# Patient Record
Sex: Female | Born: 1985 | Race: White | Hispanic: No | Marital: Single
Health system: Southern US, Community
[De-identification: ages and names within clinical notes are randomized; demographics above are authoritative.]

---
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
## 2006-02-15 ENCOUNTER — Ambulatory Visit: Payer: Self-pay | Admitting: Family Medicine

## 2006-04-26 ENCOUNTER — Ambulatory Visit: Payer: Self-pay | Admitting: Family Medicine

## 2006-09-06 ENCOUNTER — Ambulatory Visit: Payer: Self-pay | Admitting: Family Medicine

## 2006-09-19 ENCOUNTER — Ambulatory Visit: Payer: Self-pay | Admitting: Family Medicine

## 2006-09-19 DIAGNOSIS — IMO0002 Reserved for concepts with insufficient information to code with codable children: Secondary | ICD-10-CM | POA: Insufficient documentation

## 2006-09-25 ENCOUNTER — Telehealth: Payer: Self-pay | Admitting: Family Medicine

## 2006-09-29 ENCOUNTER — Encounter: Admission: RE | Admit: 2006-09-29 | Discharge: 2006-09-29 | Payer: Self-pay | Admitting: Family Medicine

## 2006-09-29 ENCOUNTER — Ambulatory Visit: Payer: Self-pay | Admitting: Family Medicine

## 2006-10-02 ENCOUNTER — Encounter: Payer: Self-pay | Admitting: Family Medicine

## 2006-11-24 IMAGING — CR DG FOOT COMPLETE 3+V*L*
3 series · 3 of 3 positions shown · non-contrast
Comparison: none

CLINICAL DATA: Lateral foot pain and swelling

LEFT FOOT - 3 VIEW

[view not recorded (1 of 3)]
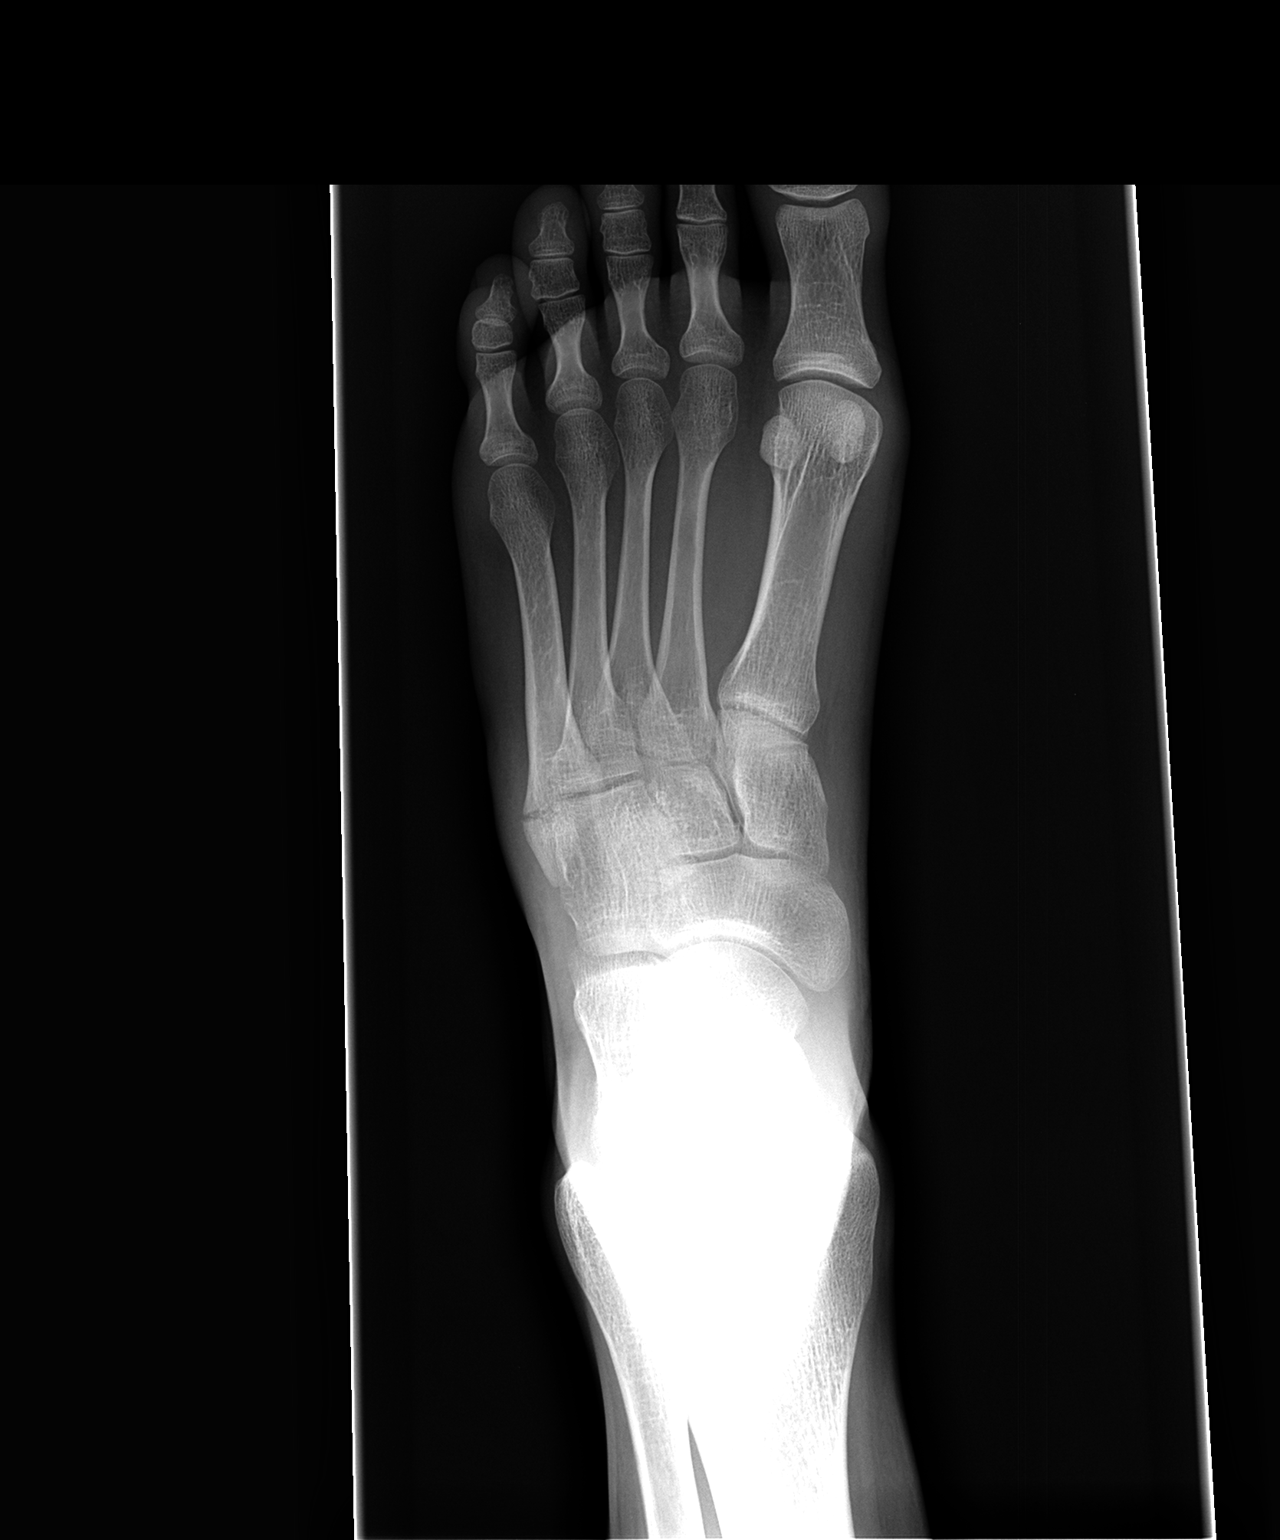

[view not recorded (2 of 3)]
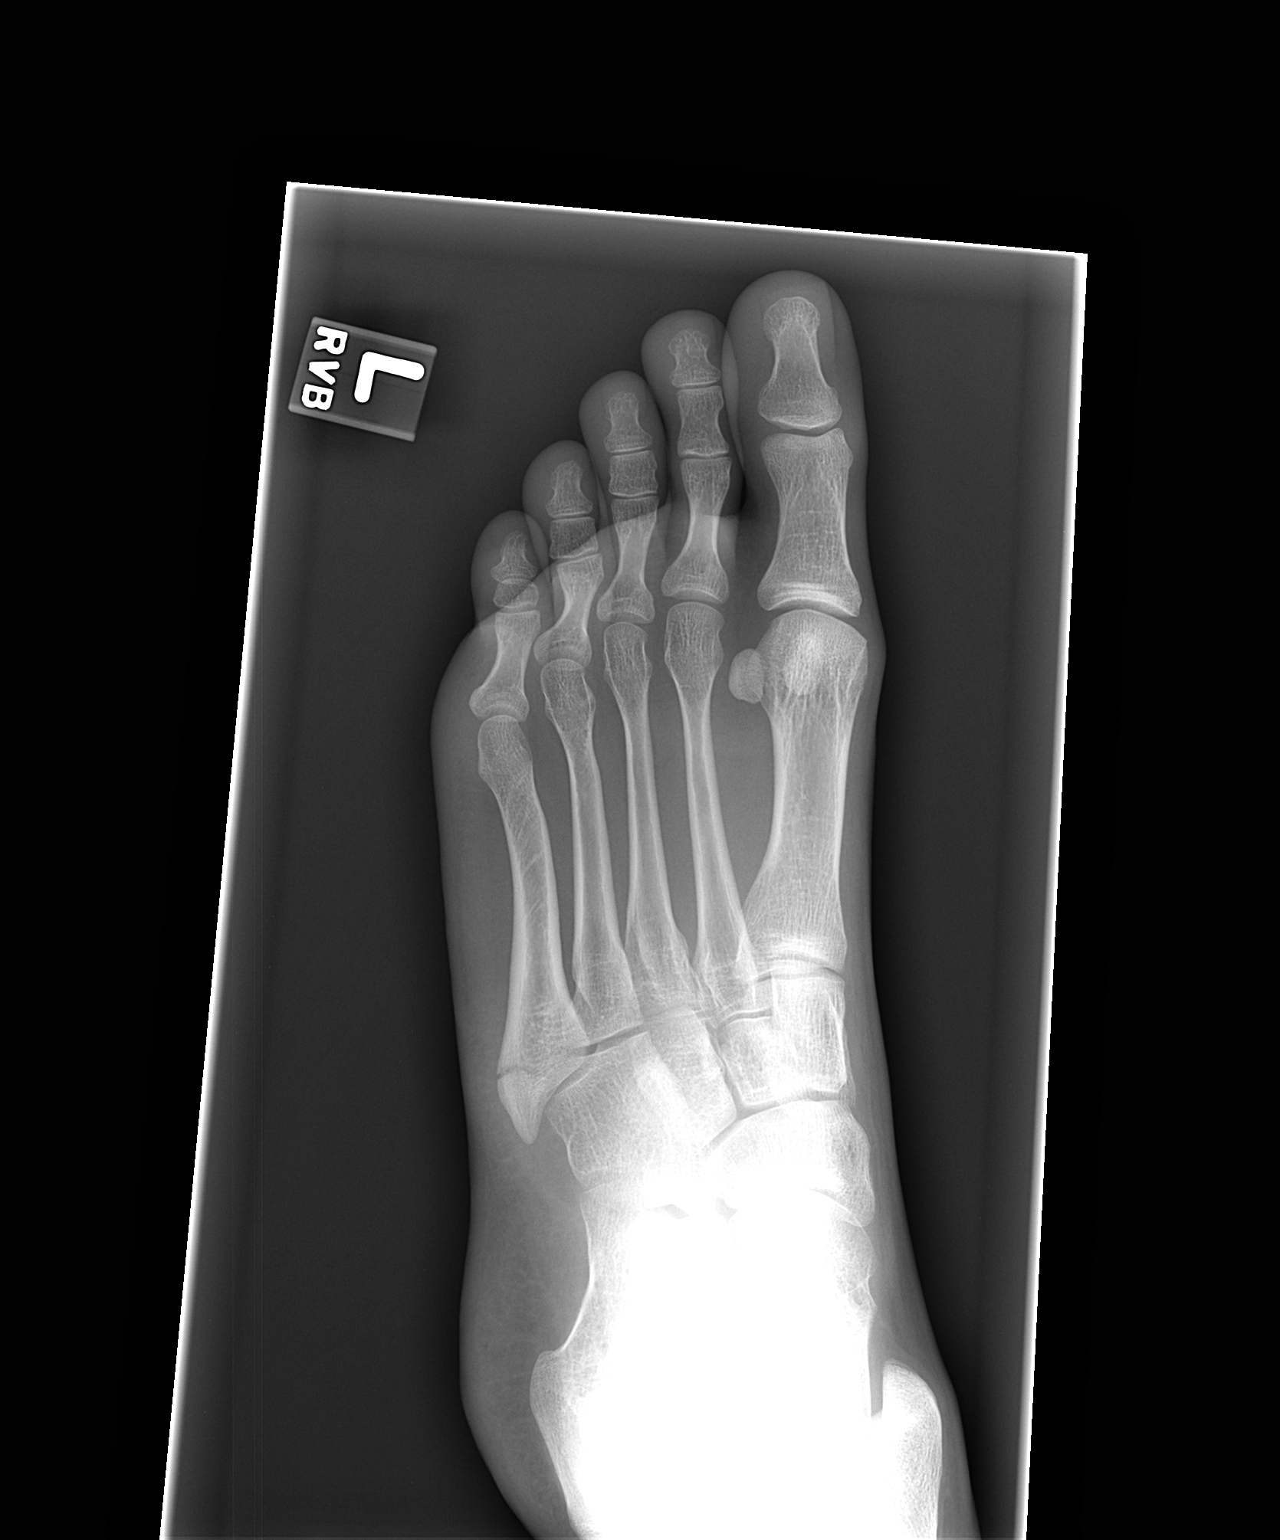

[view not recorded (3 of 3)]
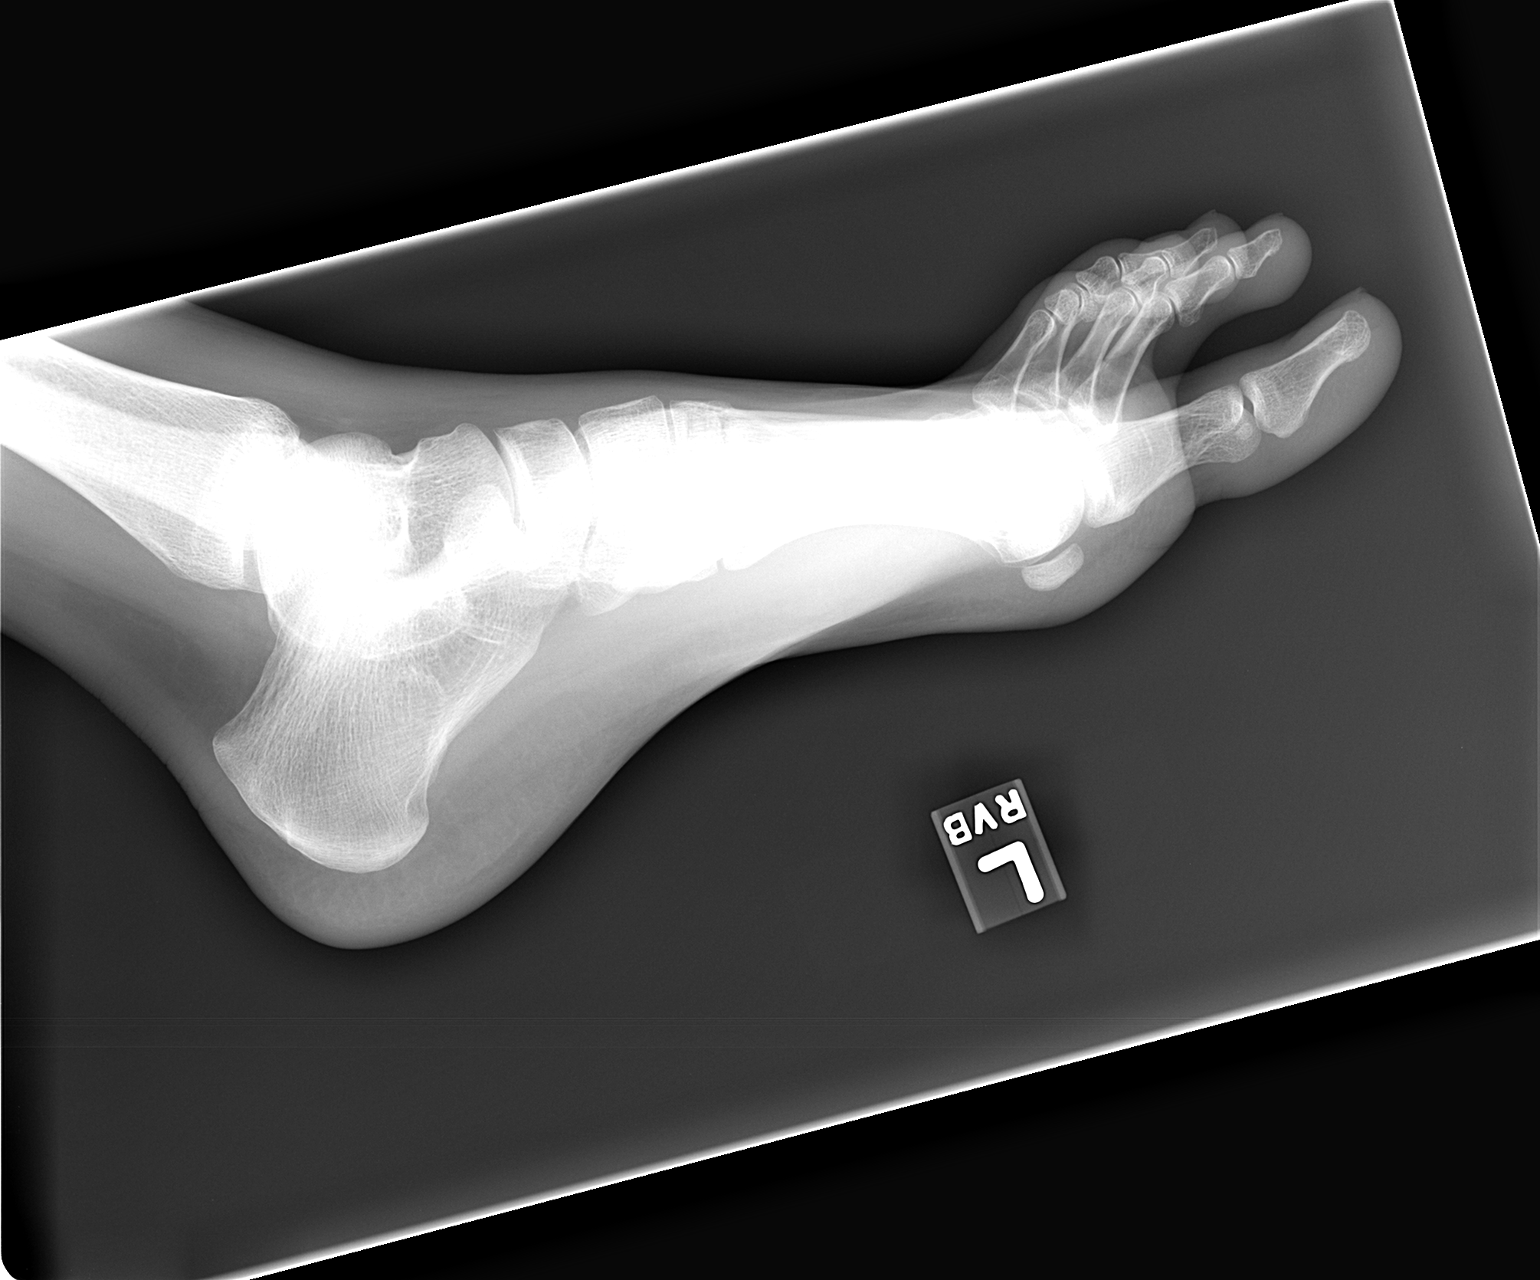

[3 of 3 positions shown; findings below may reference images not displayed]

FINDINGS: There is a fracture through the base of the left fifth metatarsal.
Minimal displacement. No additional bony abnormality.

IMPRESSION

Fracture through the base of the left fifth metatarsal

## 2007-02-14 ENCOUNTER — Ambulatory Visit: Payer: Self-pay | Admitting: Family Medicine

## 2007-03-26 ENCOUNTER — Other Ambulatory Visit: Admission: RE | Admit: 2007-03-26 | Discharge: 2007-03-26 | Payer: Self-pay | Admitting: Obstetrics and Gynecology

## 2007-10-30 ENCOUNTER — Ambulatory Visit: Payer: Self-pay | Admitting: Family Medicine

## 2007-10-30 DIAGNOSIS — L0100 Impetigo, unspecified: Secondary | ICD-10-CM | POA: Insufficient documentation

## 2007-11-12 ENCOUNTER — Emergency Department (HOSPITAL_COMMUNITY): Admission: EM | Admit: 2007-11-12 | Discharge: 2007-11-12 | Payer: Self-pay | Admitting: Emergency Medicine

## 2007-11-12 ENCOUNTER — Telehealth: Payer: Self-pay | Admitting: Family Medicine

## 2007-11-24 ENCOUNTER — Emergency Department (HOSPITAL_COMMUNITY): Admission: EM | Admit: 2007-11-24 | Discharge: 2007-11-24 | Payer: Self-pay | Admitting: Emergency Medicine

## 2008-02-01 ENCOUNTER — Ambulatory Visit: Payer: Self-pay | Admitting: Family Medicine

## 2008-04-14 ENCOUNTER — Ambulatory Visit: Payer: Self-pay | Admitting: Family Medicine

## 2008-04-14 DIAGNOSIS — J309 Allergic rhinitis, unspecified: Secondary | ICD-10-CM | POA: Insufficient documentation

## 2010-10-05 LAB — CULTURE, ROUTINE-ABSCESS
Culture: NO GROWTH
Gram Stain: NONE SEEN

## 2016-03-03 ENCOUNTER — Ambulatory Visit: Payer: Self-pay | Admitting: *Deleted

## 2016-03-07 ENCOUNTER — Encounter: Payer: Self-pay | Admitting: *Deleted

## 2016-03-07 NOTE — Progress Notes (Signed)
This encounter was created in error - please disregard.
